# Patient Record
Sex: Male | Born: 1990 | Hispanic: Yes | Marital: Single
Health system: Southern US, Community
[De-identification: ages and names within clinical notes are randomized; demographics above are authoritative.]

---

## 2018-06-21 ENCOUNTER — Emergency Department (HOSPITAL_COMMUNITY)
Admission: EM | Admit: 2018-06-21 | Discharge: 2018-06-21 | Disposition: A | Discharge status: Home / Self Care | Payer: Self-pay | Attending: Emergency Medicine | Admitting: Emergency Medicine

## 2018-06-21 ENCOUNTER — Emergency Department (HOSPITAL_COMMUNITY): Payer: Self-pay

## 2018-06-21 ENCOUNTER — Encounter (HOSPITAL_COMMUNITY): Payer: Self-pay | Admitting: *Deleted

## 2018-06-21 DIAGNOSIS — Y999 Unspecified external cause status: Secondary | ICD-10-CM | POA: Insufficient documentation

## 2018-06-21 DIAGNOSIS — X500XXA Overexertion from strenuous movement or load, initial encounter: Secondary | ICD-10-CM | POA: Insufficient documentation

## 2018-06-21 DIAGNOSIS — Y939 Activity, unspecified: Secondary | ICD-10-CM | POA: Insufficient documentation

## 2018-06-21 DIAGNOSIS — Y9289 Other specified places as the place of occurrence of the external cause: Secondary | ICD-10-CM | POA: Insufficient documentation

## 2018-06-21 DIAGNOSIS — S39012A Strain of muscle, fascia and tendon of lower back, initial encounter: Secondary | ICD-10-CM | POA: Insufficient documentation

## 2018-06-21 LAB — CBC WITH DIFFERENTIAL/PLATELET
Abs Immature Granulocytes: 0.02 10*3/uL (ref 0.00–0.07)
Basophils Absolute: 0 10*3/uL (ref 0.0–0.1)
Basophils Relative: 0 %
EOS PCT: 4 %
Eosinophils Absolute: 0.3 10*3/uL (ref 0.0–0.5)
HCT: 48.8 % (ref 39.0–52.0)
Hemoglobin: 16.4 g/dL (ref 13.0–17.0)
Immature Granulocytes: 0 %
Lymphocytes Relative: 18 %
Lymphs Abs: 1.6 10*3/uL (ref 0.7–4.0)
MCH: 29.1 pg (ref 26.0–34.0)
MCHC: 33.6 g/dL (ref 30.0–36.0)
MCV: 86.5 fL (ref 80.0–100.0)
Monocytes Absolute: 0.4 10*3/uL (ref 0.1–1.0)
Monocytes Relative: 5 %
Neutro Abs: 6.8 10*3/uL (ref 1.7–7.7)
Neutrophils Relative %: 73 %
Platelets: 324 10*3/uL (ref 150–400)
RBC: 5.64 MIL/uL (ref 4.22–5.81)
RDW: 13.1 % (ref 11.5–15.5)
WBC: 9.2 10*3/uL (ref 4.0–10.5)
nRBC: 0 % (ref 0.0–0.2)

## 2018-06-21 LAB — BASIC METABOLIC PANEL
Anion gap: 9 (ref 5–15)
BUN: 14 mg/dL (ref 6–20)
CO2: 22 mmol/L (ref 22–32)
Calcium: 9.4 mg/dL (ref 8.9–10.3)
Chloride: 108 mmol/L (ref 98–111)
Creatinine, Ser: 1.16 mg/dL (ref 0.61–1.24)
GFR calc Af Amer: >60 mL/min (ref >60)
GFR calc non Af Amer: >60 mL/min (ref >60)
Glucose, Bld: 130 mg/dL — ABNORMAL HIGH (ref 70–99)
Potassium: 3.9 mmol/L (ref 3.5–5.1)
Sodium: 139 mmol/L (ref 135–145)

## 2018-06-21 LAB — URINALYSIS, ROUTINE W REFLEX MICROSCOPIC
Bilirubin Urine: NEGATIVE
Glucose, UA: NEGATIVE mg/dL
HGB URINE DIPSTICK: NEGATIVE
Ketones, ur: 20 mg/dL — AB
Leukocytes, UA: NEGATIVE
Nitrite: NEGATIVE
Protein, ur: NEGATIVE mg/dL
Specific Gravity, Urine: 1.016 (ref 1.005–1.030)
pH: 7 (ref 5.0–8.0)

## 2018-06-21 MED ORDER — DEXAMETHASONE SODIUM PHOSPHATE 10 MG/ML IJ SOLN: 10.0000 mg | Freq: Once | INTRAMUSCULAR | Status: DC

## 2018-06-21 MED ORDER — IBUPROFEN 600 MG PO TABS: 600.0000 mg | ORAL_TABLET | Freq: Four times a day (QID) | ORAL | 0 refills | Status: AC | PRN

## 2018-06-21 MED ORDER — HYDROMORPHONE HCL 1 MG/ML IJ SOLN
1.0000 mg | Freq: Once | INTRAMUSCULAR | Status: AC
  Administered 2018-06-21: 1 mg via INTRAVENOUS
  Filled 2018-06-21: qty 1

## 2018-06-21 MED ORDER — METHOCARBAMOL 1000 MG/10ML IJ SOLN: 1000.0000 mg | Freq: Once | INTRAMUSCULAR | Status: DC

## 2018-06-21 MED ORDER — CYCLOBENZAPRINE HCL 10 MG PO TABS: 10.0000 mg | ORAL_TABLET | Freq: Two times a day (BID) | ORAL | 0 refills | Status: AC | PRN

## 2018-06-21 MED ORDER — PREDNISONE 20 MG PO TABS: ORAL_TABLET | ORAL | 0 refills | Status: AC

## 2018-06-21 MED ORDER — METHOCARBAMOL 1000 MG/10ML IJ SOLN
500.0000 mg | Freq: Once | INTRAVENOUS | Status: AC
  Administered 2018-06-21: 500 mg via INTRAVENOUS
  Filled 2018-06-21: qty 5

## 2018-06-21 MED ORDER — ONDANSETRON HCL 4 MG/2ML IJ SOLN
4.0000 mg | Freq: Once | INTRAMUSCULAR | Status: AC
  Administered 2018-06-21: 4 mg via INTRAVENOUS
  Filled 2018-06-21: qty 2

## 2018-06-21 NOTE — ED Provider Notes (Signed)
[facility] Provider Note   CSN: [id number] Arrival date & time: 06/21/18  1527     History   Chief Complaint Chief Complaint  Patient presents with  . Back Pain    HPI Adam Mckay is a 28 y.o. male.  The history is provided by the patient. No language interpreter was used.     28 year old  male presenting for evaluation of back pain.  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  Sts he may not have used appropriate lifting technique when he felt acute onset of severe throbbing pain to his lower back.  Pain has been persistent then, worsening with any kind of movement.  Sts he has not been able to lift either of his legs or walk since the incident.  Pain spread across his lower back but does not radiate down to his legs.  Pain is "12/10".  He tries resting and taking ibuprofen at home without relief.  No report of fever, lightheadedness, abd pain, dysuria, hematuria, bowel/bladder incontinence or saddle anesthesia.  No hx of IVDU or active cancer.    History reviewed. No pertinent past medical history.  There are no active problems to display for this patient.   The histories are not reviewed yet. Please review them in the "History" navigator section and refresh this SmartLink.      Home Medications    Prior to Admission medications   Not on File    Family History History reviewed. No pertinent family history.  Social History Social History   Tobacco Use  . Smoking status: Not on file  Substance Use Topics  . Alcohol use: Not on file  . Drug use: Not on file     Allergies   Patient has no known allergies.   Review of Systems Review of Systems  All other systems reviewed and are negative.    Physical Exam Updated Vital Signs BP 109/75 (BP Location: Right Arm)   Pulse 84   Temp 98.4 F (36.9 C) (Oral)   Resp 16   SpO2 97%   Physical Exam Vitals  signs and nursing note reviewed.  Constitutional:      General: He is not in acute distress.    Appearance: He is well-developed.  HENT:     Head: Atraumatic.  Eyes:     Conjunctiva/sclera: Conjunctivae normal.  Neck:     Musculoskeletal: Neck supple.  Abdominal:     Palpations: Abdomen is soft.     Tenderness: There is no abdominal tenderness.  Musculoskeletal:        General: Tenderness (exquisite tenderness about the lower back and paraspinal muscle on palpation without crepitus or step off.  no overlying skin changes.  positive SLR bilaterally, intact distal pedal pulses) present.  Skin:    Findings: No rash.  Neurological:     Mental Status: He is alert.      ED Treatments / Results  Labs (all labs ordered are listed, but only abnormal results are displayed) Labs Reviewed  BASIC METABOLIC PANEL - Abnormal; Notable for the following components:      Result Value   Glucose, Bld 130 (*)    All other components within normal limits  URINALYSIS, ROUTINE W REFLEX MICROSCOPIC - Abnormal; Notable for the following components:   Ketones, ur 20 (*)    All other components within normal limits  CBC WITH DIFFERENTIAL/PLATELET  EKG None  Radiology Dg Lumbar Spine Complete  Result Date: 06/21/2018 CLINICAL DATA:  Severe low back pain following a lifting injury 2 days ago. EXAM: LUMBAR SPINE - COMPLETE 4+ VIEW COMPARISON:  None. FINDINGS: There is no evidence of lumbar spine fracture. Alignment is normal. Intervertebral disc spaces are maintained. IMPRESSION: Normal examination. Electronically Signed   By: Beckie SaltsSteven  Reid M.D.   On: 06/21/2018 17:04    Procedures Procedures (including critical care time)  Medications Ordered in ED Medications  HYDROmorphone (DILAUDID) injection 1 mg (1 mg Intravenous Given 06/21/18 1604)  ondansetron (ZOFRAN) injection 4 mg (4 mg Intravenous Given 06/21/18 1604)  HYDROmorphone (DILAUDID) injection 1 mg (1 mg Intravenous Given 06/21/18 1717)    methocarbamol (ROBAXIN) 500 mg in dextrose 5 % 50 mL IVPB (0 mg Intravenous Stopped 06/21/18 1825)     Initial Impression / Assessment and Plan / ED Course  I have reviewed the triage vital signs and the nursing notes.  Pertinent labs & imaging results that were available during my care of the patient were reviewed by me and considered in my medical decision making (see chart for details).     BP 112/68 (BP Location: Right Arm)   Pulse 81   Temp 98.4 F (36.9 C) (Oral)   Resp 17   SpO2 98%    Final Clinical Impressions(s) / ED Diagnoses   Final diagnoses:  Strain of lumbar region, initial encounter    ED Discharge Orders         Ordered    predniSONE (DELTASONE) 20 MG tablet     06/21/18 2041    cyclobenzaprine (FLEXERIL) 10 MG tablet  2 times daily PRN     06/21/18 2041    ibuprofen (ADVIL,MOTRIN) 600 MG tablet  Every 6 hours PRN     06/21/18 2041         4:03 PM Pt with pain to lower back after lifting heavy bucket at work 2 days ago.  Pain worsening with movement, difficulty ambulating 2/2 pain. no numbness, no red flags.  Since pt is having difficulty ambulating, will obtain lspine xray, pain medication given, basic labs obtain in the event he's requiring admission or advance imaging.   5:15 PM Labs are reassuring, initial L-spine x-ray unremarkable.  However, patient report minimal improvement of his pain after initial dose of 1 mg of Dilaudid.  Any slight movements office lower extremity causing significant discomfort.  Patella is intact, normal dorsiflexion and plantarflexion of his feet bilaterally.  Care discussed with Dr. Hyacinth MeekerMiller.  At this time will provide additional symptomatic treatment including Dilaudid, Robaxin, and Decadron.  8:39 PM Labs are reassuring, urine without signs of urinary tract infection, normal WBC, normal H&H, normal electrolyte panel.  Patient able to urinate.  He still has moderate amount of pain and I did discuss this with Dr. Hyacinth MeekerMiller, we  felt that patient does not require emergent MRI at this time.  Will provide symptomatic treatment and return precaution given.  Doubt cauda equina. Pt able to ambulate with discomfort.    Fayrene Helperran, Christoher Drudge, PA-C 06/21/18 2042    Eber HongMiller, Brian, MD 06/23/18 [phone]

## 2018-06-21 NOTE — Discharge Instructions (Signed)
Take medication prescribed.  Get adequate rest.  Return if your condition worsen or if you have other concerns.

## 2018-06-21 NOTE — ED Notes (Signed)
Requested urine sample from pt ? ?

## 2018-06-21 NOTE — ED Notes (Signed)
Patient transported to X-ray 

## 2018-06-21 NOTE — ED Notes (Signed)
Patient verbalizes understanding of discharge instructions. Opportunity for questioning and answers were provided. Armband removed by staff, pt discharged from ED in wheelchair.  

## 2018-06-21 NOTE — ED Triage Notes (Signed)
Pt in c/o severe lower back pain that started on Saturday after an injury, pt went to his PCP and was given medication but was sent here for further pain control, pt having trouble walking and c/o weakness to his legs

## 2018-06-21 NOTE — ED Provider Notes (Signed)
Medical screening examination/treatment/procedure(s) were conducted as a shared visit with non-physician practitioner(s) and myself.  I personally evaluated the patient during the encounter.  Clinical Impression:   Final diagnoses:  Strain of lumbar region, initial encounter    The patient is a 28 year old male, he reports a prior incident of a back injury like this in the past where his back went into spasm, it was when he was much younger.  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  He has not had any numbness or weakness going down his legs and when he lays on his back his pain is much better when he tries to move around in the bed or sit up it gets much worse.  He has been given some pain medicine here and on my exam he appears very comfortable with normal vital signs.  He has normal strength and sensation in the bilateral legs but when he tries to move it causes increasing pain over his back.  X-rays negative for acute findings, likely muscle spasm, patient will be discharged with anti-inflammatories, muscle relaxants and close follow-up with his doctor.  He is aware of the indications for return, I do not see any signs of cauda equina  Results for orders placed or performed during the hospital encounter of 06/21/18  CBC with Differential/Platelet  Result Value Ref Range   WBC 9.2 4.0 - 10.5 K/uL   RBC 5.64 4.22 - 5.81 MIL/uL   Hemoglobin 16.4 13.0 - 17.0 g/dL   HCT 56.248.8 13.039.0 - 86.552.0 %   MCV 86.5 80.0 - 100.0 fL   MCH 29.1 26.0 - 34.0 pg   MCHC 33.6 30.0 - 36.0 g/dL   RDW 78.413.1 69.611.5 - 29.515.5 %   Platelets 324 150 - 400 K/uL   nRBC 0.0  0.0 - 0.2 %   Neutrophils Relative % 73 %   Neutro Abs 6.8 1.7 - 7.7 K/uL   Lymphocytes Relative 18 %   Lymphs Abs 1.6 0.7 - 4.0 K/uL   Monocytes Relative 5 %   Monocytes Absolute 0.4 0.1 - 1.0 K/uL   Eosinophils Relative 4 %   Eosinophils Absolute 0.3 0.0 - 0.5 K/uL   Basophils Relative 0 %   Basophils Absolute 0.0 0.0 - 0.1 K/uL   Immature Granulocytes 0 %   Abs Immature Granulocytes 0.02 0.00 - 0.07 K/uL  Basic metabolic panel  Result Value Ref Range   Sodium 139 135 - 145 mmol/L   Potassium 3.9 3.5 - 5.1 mmol/L   Chloride 108 98 - 111 mmol/L   CO2 22 22 - 32 mmol/L   Glucose, Bld 130 (H) 70 - 99 mg/dL   BUN 14 6 - 20 mg/dL   Creatinine, Ser 2.841.16 0.61 - 1.24 mg/dL   Calcium 9.4 8.9 - 13.210.3 mg/dL   GFR calc non Af Amer >60 >60 mL/min   GFR calc Af Amer >60 >60 mL/min   Anion gap 9 5 - 15   Dg Lumbar Spine Complete  Result Date: 06/21/2018 CLINICAL DATA:  Severe  low back pain following a lifting injury 2 days ago. EXAM: LUMBAR SPINE - COMPLETE 4+ VIEW COMPARISON:  None. FINDINGS: There is no evidence of lumbar spine fracture. Alignment is normal. Intervertebral disc spaces are maintained. IMPRESSION: Normal examination. Electronically Signed   By: Beckie SaltsSteven  Reid M.D.   On: 06/21/2018 17:04      Eber HongMiller, Royalti Schauf, MD 06/23/18 1144

## 2020-01-24 IMAGING — DX DG LUMBAR SPINE COMPLETE 4+V
5 series · 5 of 5 positions shown · non-contrast
Comparison: None.

CLINICAL DATA: Severe low back pain following a lifting injury 2
days ago.

EXAM:
LUMBAR SPINE - COMPLETE 4+ VIEW

[t lumbar spine ap]
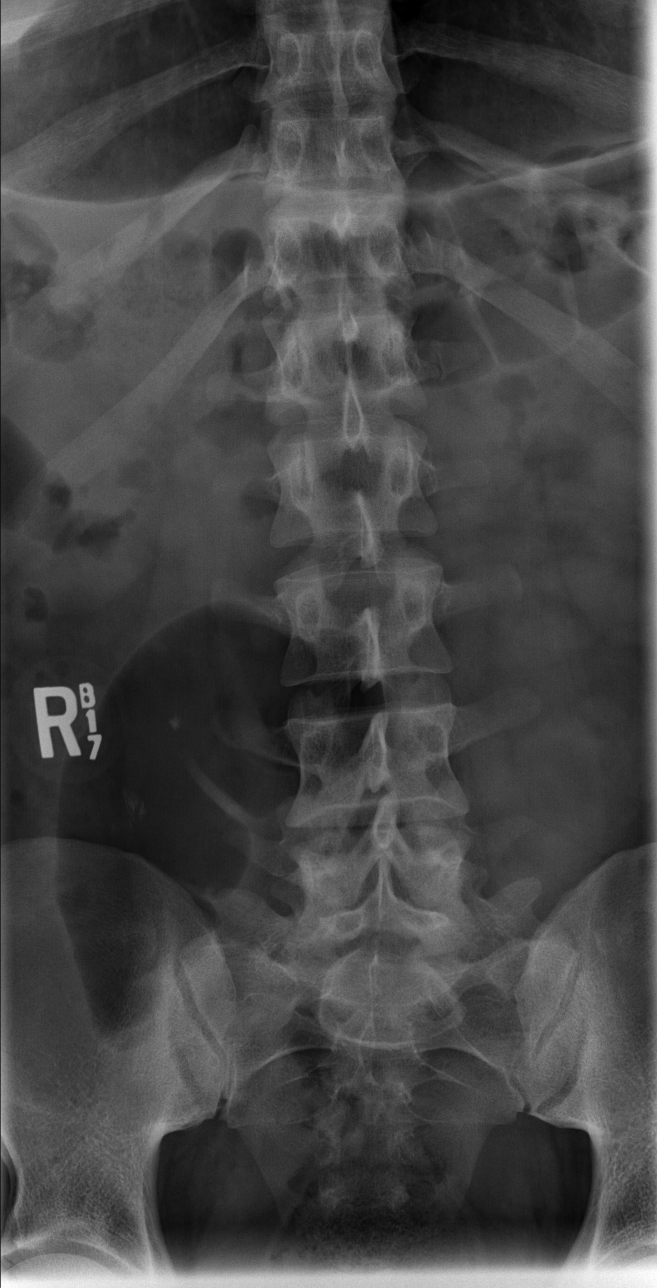

[t lumbar spine obl (1 of 2)]
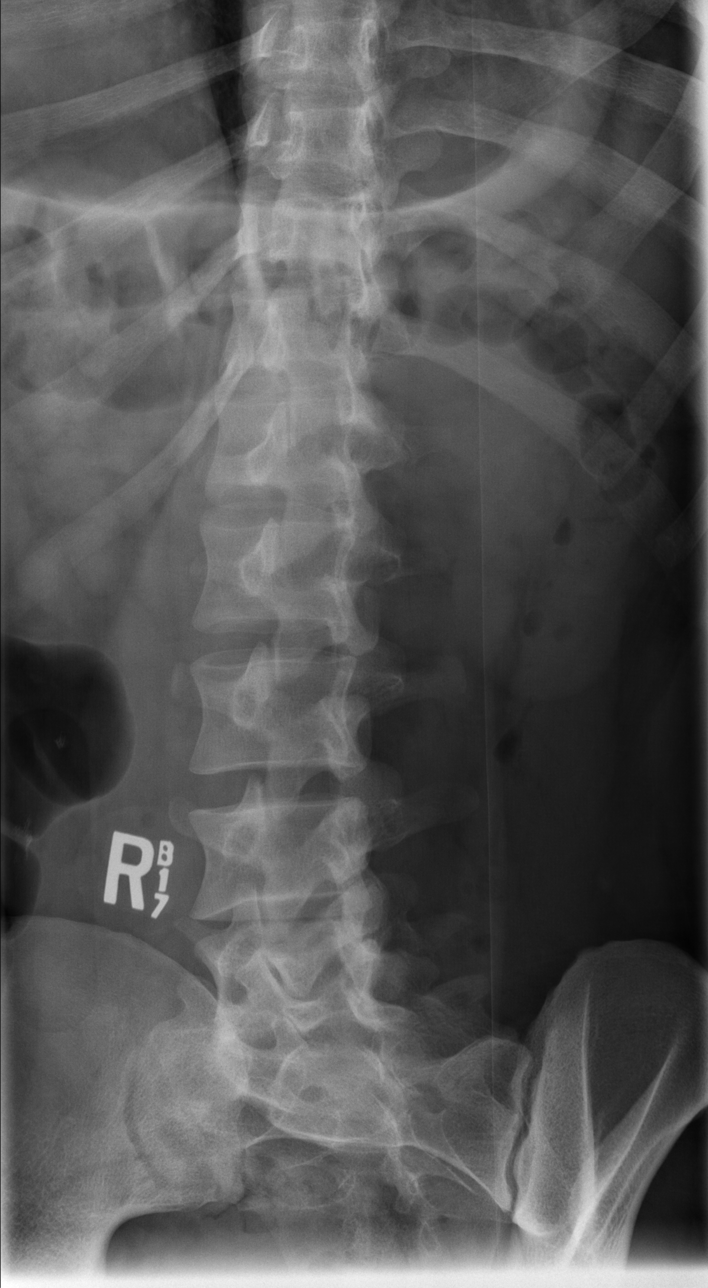

[t lumbar spine obl (2 of 2)]
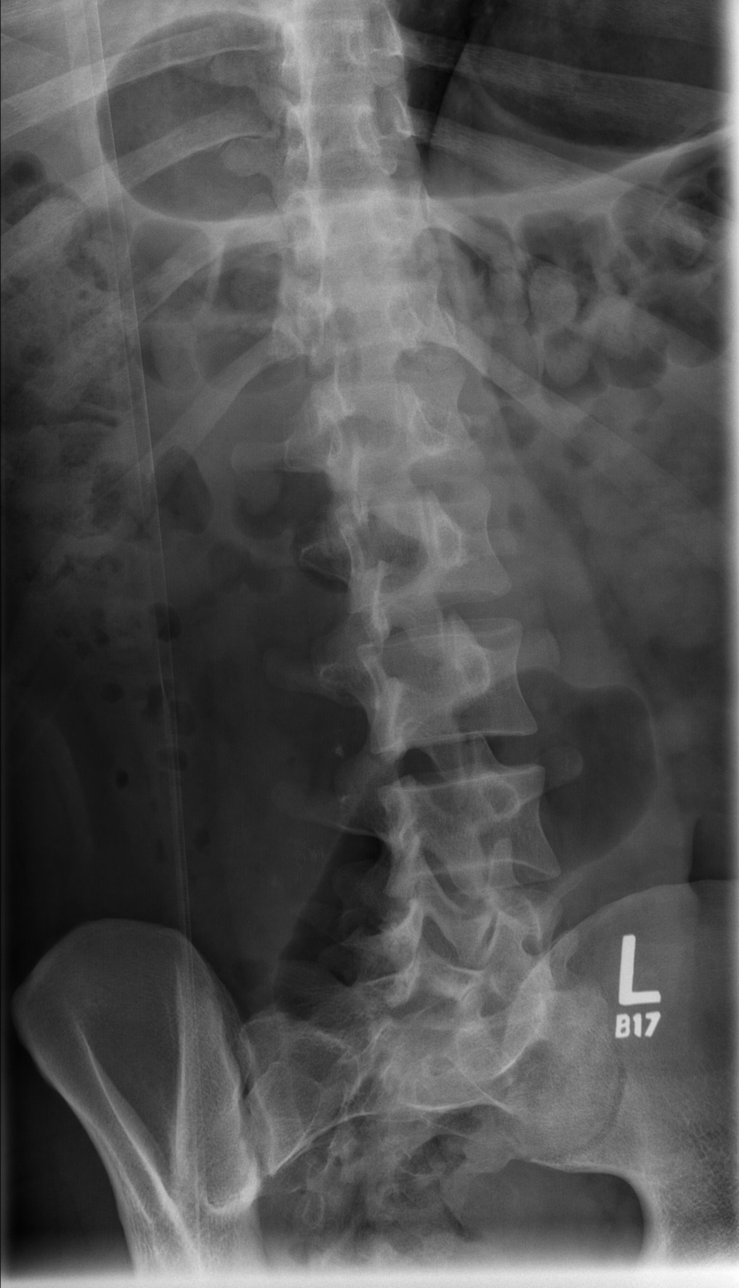

[t lumbar spine lat]
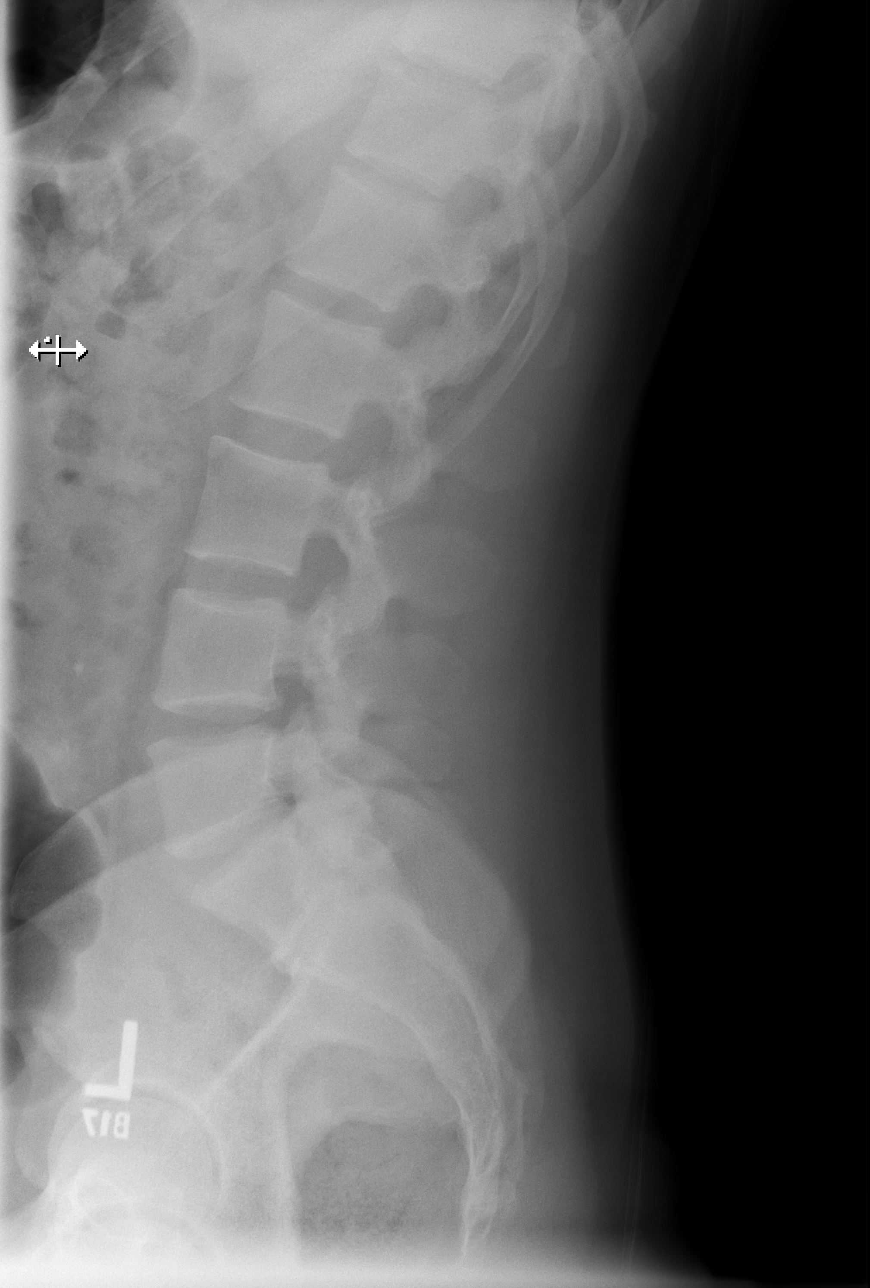

[t lumbar l-5 s-1 spot]
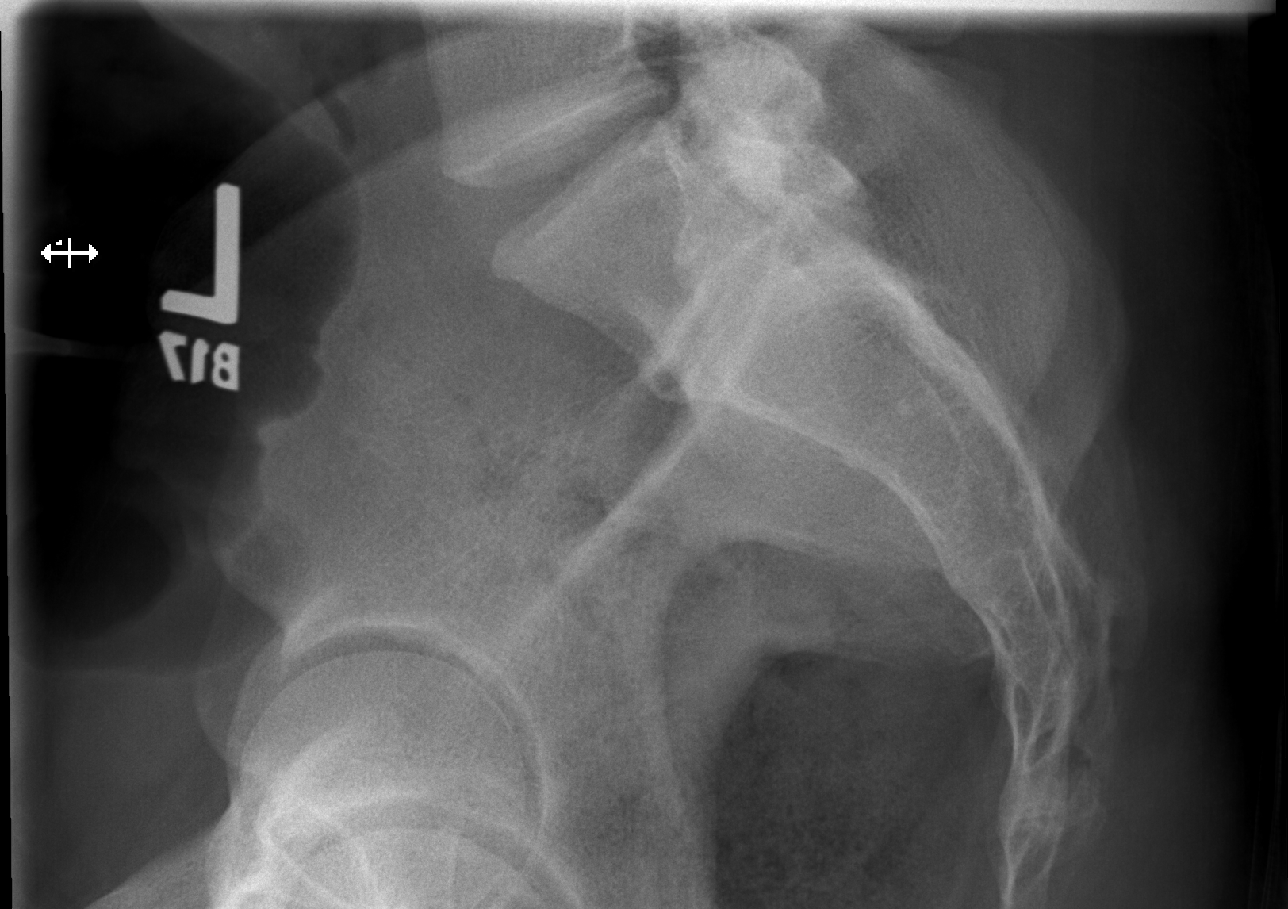

[5 of 5 positions shown; findings below may reference images not displayed]

FINDINGS: There is no evidence of lumbar spine fracture. Alignment is normal.
Intervertebral disc spaces are maintained.
IMPRESSION: Normal examination.
